# Patient Record
Sex: Male | Born: 1960 | Race: White | Hispanic: No | Marital: Married
Health system: Southern US, Community
[De-identification: ages and names within clinical notes are randomized; demographics above are authoritative.]

---

## 2005-02-08 ENCOUNTER — Inpatient Hospital Stay (HOSPITAL_COMMUNITY): Admission: RE | Admit: 2005-02-08 | Discharge: 2005-02-11 | Payer: Self-pay | Admitting: Neurosurgery

## 2006-04-04 ENCOUNTER — Observation Stay (HOSPITAL_COMMUNITY): Admission: RE | Admit: 2006-04-04 | Discharge: 2006-04-05 | Payer: Self-pay | Admitting: Neurosurgery

## 2006-10-03 IMAGING — CR DG SPINE 1V PORT
1 series · 1 of 1 positions shown · non-contrast
Comparison: none

CLINICAL DATA: Laminectomy L4-5.
 PORTABLE LUMBAR SPINE ? 1 VIEW:

[view not recorded]
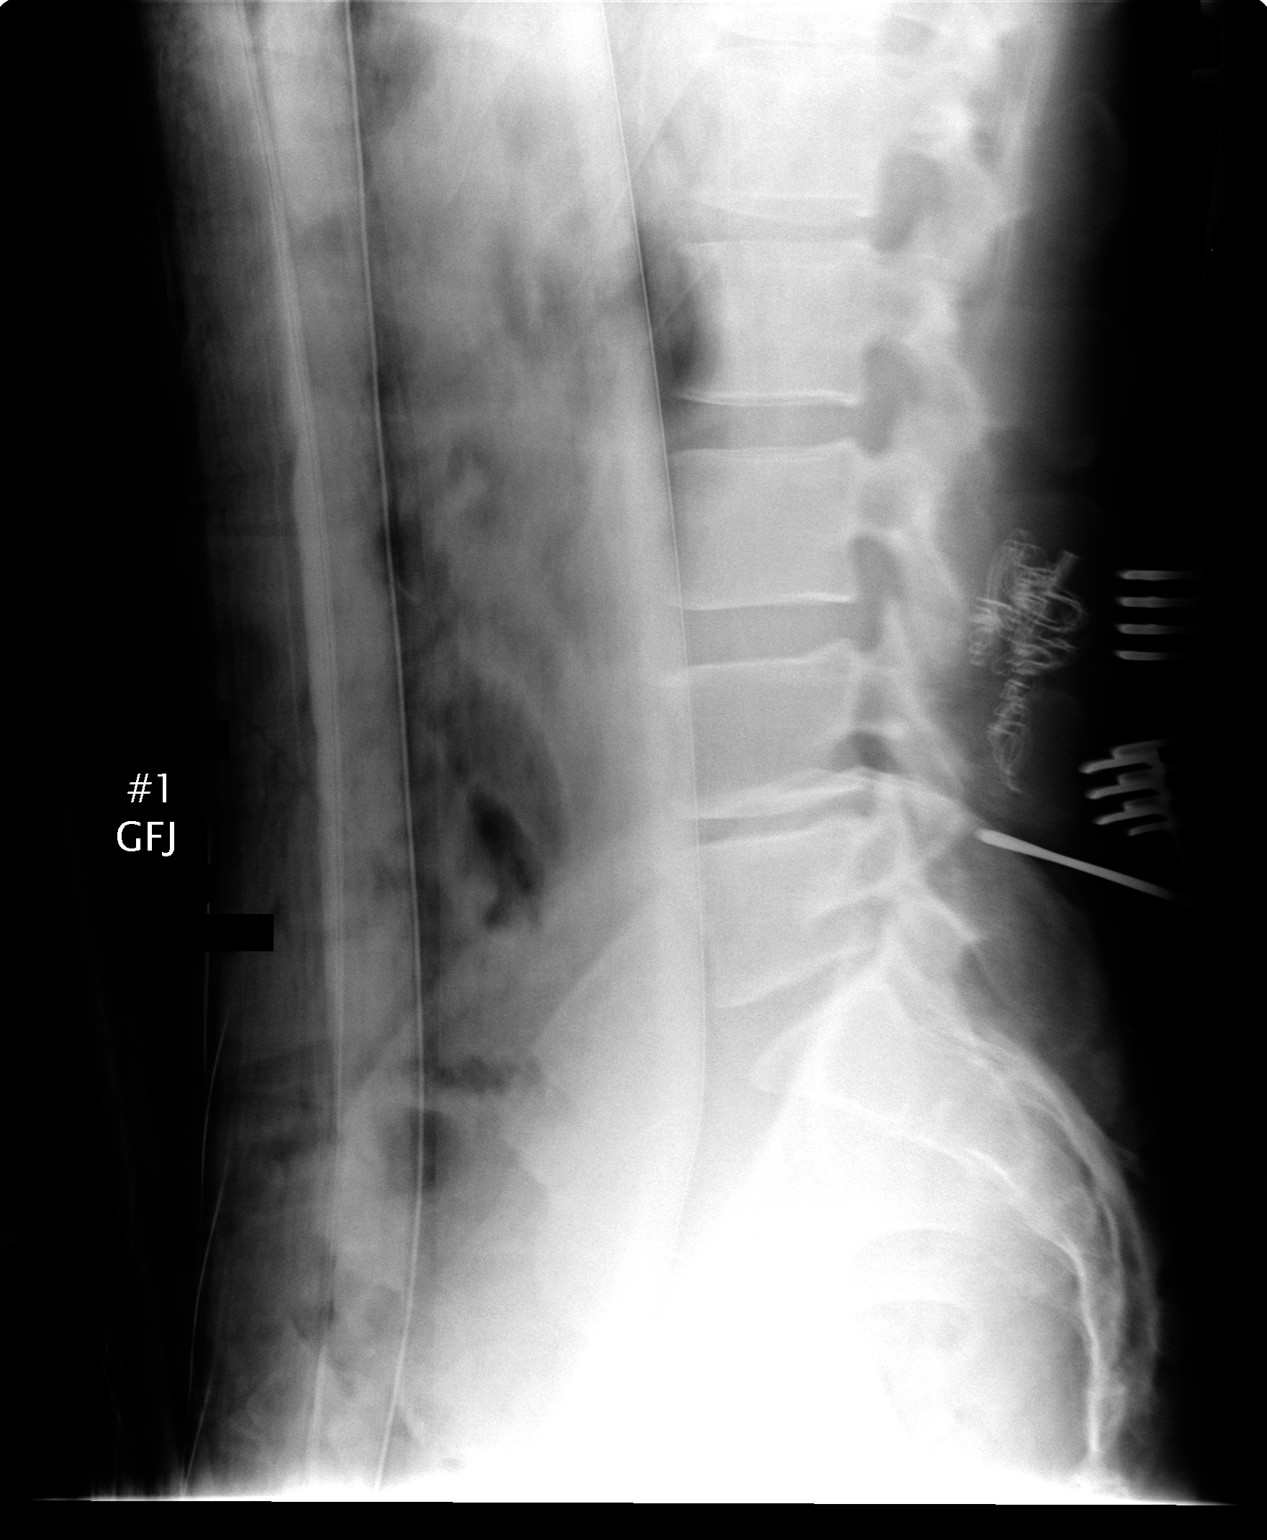

[1 of 1 positions shown; findings below may reference images not displayed]

FINDINGS: Portable lateral view of the lumbar spine labeled #1 shows an instrument beneath the spinous process of L-4 directed toward the L4-5 interspace.
IMPRESSION: Localization of L4-5.

## 2007-11-27 IMAGING — CR DG LUMBAR SPINE 2-3V
1 series · 1 of 1 positions shown · non-contrast
Comparison: none

CLINICAL DATA: L5-S1 microdiskectomy.  
 PORTABLE LATERAL LUMBAR SPINE - 2 VIEW:

[view not recorded]
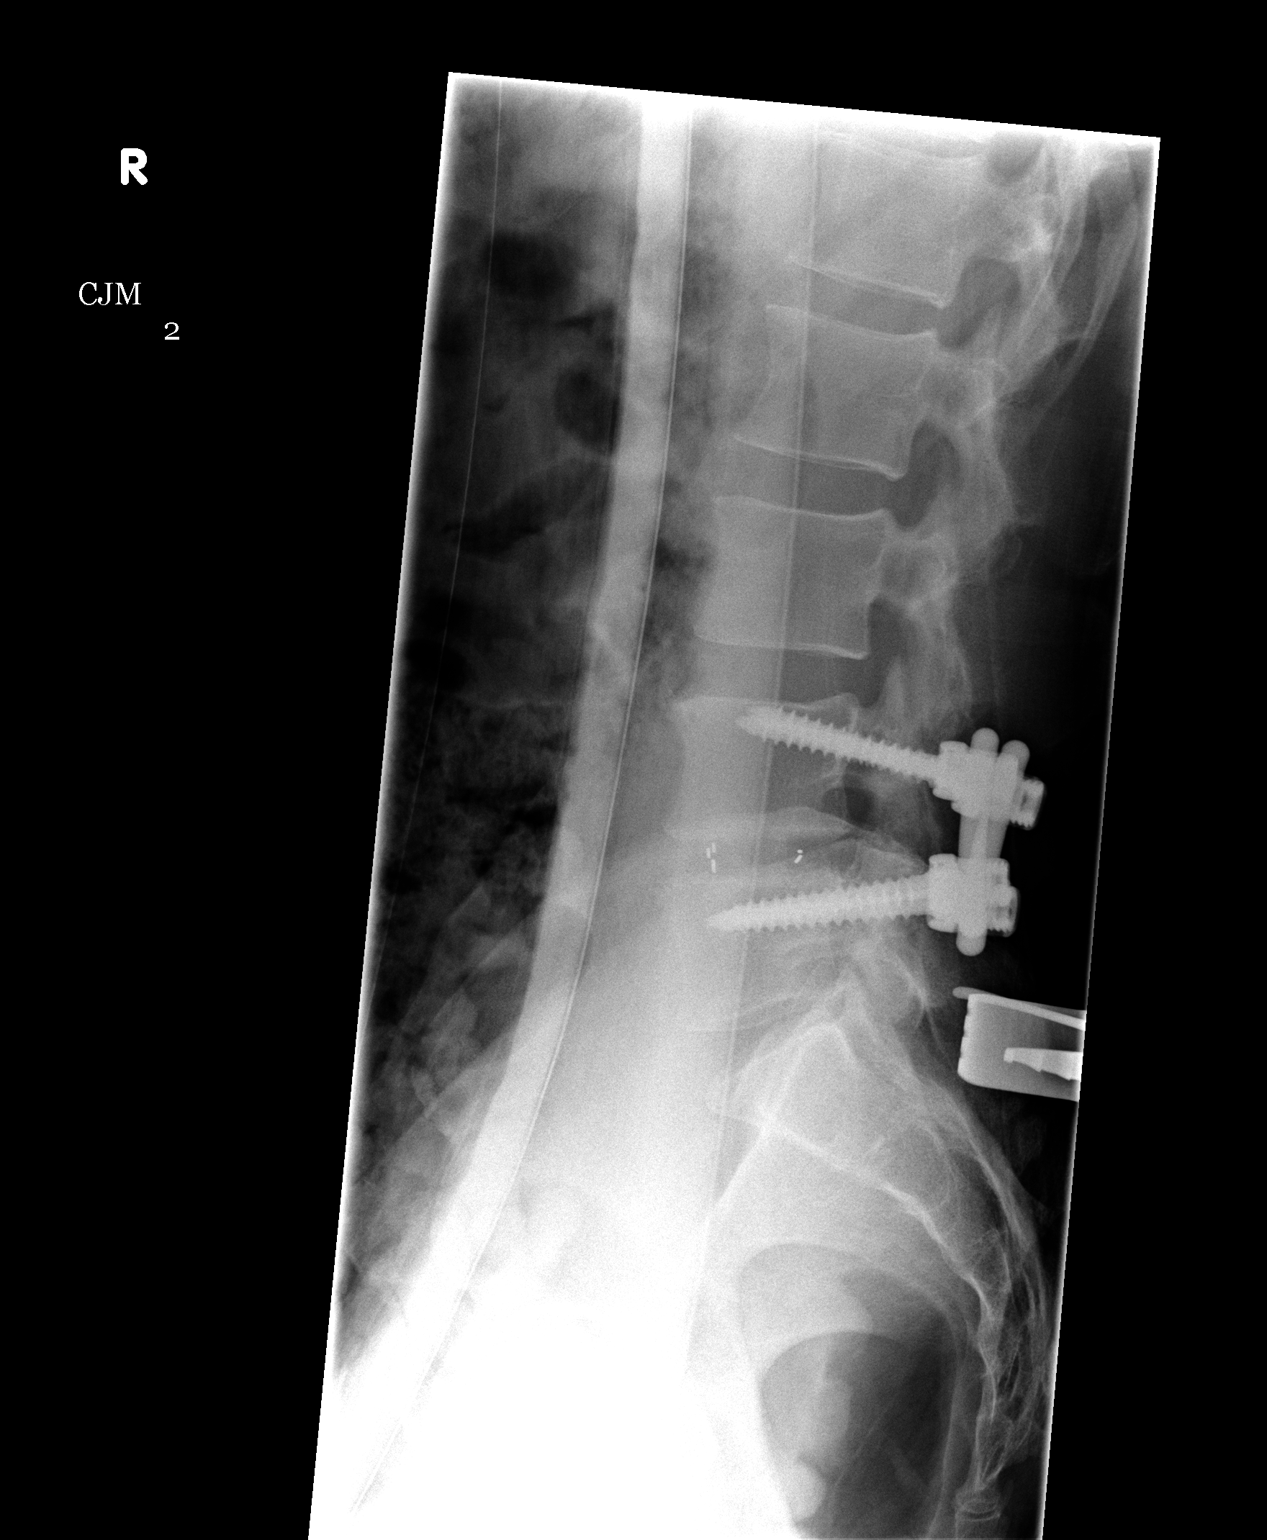

[1 of 1 positions shown; findings below may reference images not displayed]

FINDINGS: Film number 1 reveals posterior pedicle screws and interbody spacer at the L4-5 level.  There is an instrument directed at the L5-S1 interspace.  
 Film number 2 reveals tissue spreaders and an instrument directed at the L5-S1 interspace.
IMPRESSION: L5-S1 localized.

## 2008-12-08 ENCOUNTER — Ambulatory Visit: Payer: Self-pay | Admitting: Cardiology

## 2012-07-02 DIAGNOSIS — Z8249 Family history of ischemic heart disease and other diseases of the circulatory system: Secondary | ICD-10-CM

## 2019-03-08 ENCOUNTER — Ambulatory Visit: Payer: BC Managed Care – PPO | Attending: Internal Medicine

## 2019-03-08 DIAGNOSIS — Z23 Encounter for immunization: Secondary | ICD-10-CM | POA: Insufficient documentation

## 2019-03-08 NOTE — Progress Notes (Signed)
   [facility]ov[facility] Vaccination [facility]  Name:  Charles Anthony    MRN: [id number] DOB: 24-Nov-1960  03/08/2019  [name] was observed post Covid-19 immunization for 15 minutes without incident. He was provided with Vaccine Information Sheet and instruction to access the V-Safe system.   [name] was instructed to call 911 with any severe reactions post vaccine: Marland Kitchen Difficulty breathing  . Swelling of face and throat  . A fast heartbeat  . A bad rash all over body  . Dizziness and weakness   Immunizations Administered    Name Date Dose VIS Date Route   Pfizer COVID-19 Vaccine 03/08/2019  6:30 PM 0.3 mL 12/12/2018 Intramuscular   Manufacturer: [employer], [employer]   Lot: [id number]   NDC: [address]-9

## 2019-03-29 ENCOUNTER — Ambulatory Visit: Payer: BC Managed Care – PPO | Attending: Internal Medicine

## 2019-03-29 DIAGNOSIS — Z23 Encounter for immunization: Secondary | ICD-10-CM

## 2019-03-29 NOTE — Progress Notes (Signed)
   Covid-19 Vaccination Clinic  Name:  Charles Anthony    MRN: [id number] DOB: 1960-02-06  03/29/2019  [name] was observed post Covid-19 immunization for 15 minutes without incident. He was provided with Vaccine Information Sheet and instruction to access the V-Safe system.   [name] was instructed to call 911 with any severe reactions post vaccine: Marland Kitchen Difficulty breathing  . Swelling of face and throat  . A fast heartbeat  . A bad rash all over body  . Dizziness and weakness   Immunizations Administered    Name Date Dose VIS Date Route   Pfizer COVID-19 Vaccine 03/29/2019  2:33 PM 0.3 mL 12/12/2018 Intramuscular   Manufacturer: [employer], [employer]   Lot: [id number]   NDC: [address]-3

## 2020-01-21 ENCOUNTER — Encounter: Payer: Self-pay | Admitting: Internal Medicine

## 2020-03-04 ENCOUNTER — Ambulatory Visit: Payer: BC Managed Care – PPO | Admitting: Gastroenterology
